# Patient Record
Sex: Male | Born: 1970 | Race: Black or African American | Hispanic: No | Marital: Married | Smoking: Never smoker
Health system: Southern US, Community
[De-identification: ages and names within clinical notes are randomized; demographics above are authoritative.]

---

## 1998-08-10 ENCOUNTER — Encounter (HOSPITAL_COMMUNITY): Admission: RE | Admit: 1998-08-10 | Discharge: 1998-11-08 | Payer: Self-pay

## 1998-11-13 ENCOUNTER — Emergency Department (HOSPITAL_COMMUNITY): Admission: EM | Admit: 1998-11-13 | Discharge: 1998-11-13 | Payer: Self-pay | Admitting: Emergency Medicine

## 1999-02-08 ENCOUNTER — Emergency Department (HOSPITAL_COMMUNITY): Admission: EM | Admit: 1999-02-08 | Discharge: 1999-02-08 | Payer: Self-pay | Admitting: Emergency Medicine

## 1999-04-17 ENCOUNTER — Encounter: Admission: RE | Admit: 1999-04-17 | Discharge: 1999-04-17 | Payer: Self-pay | Admitting: *Deleted

## 1999-08-01 ENCOUNTER — Emergency Department (HOSPITAL_COMMUNITY): Admission: EM | Admit: 1999-08-01 | Discharge: 1999-08-01 | Payer: Self-pay | Admitting: Emergency Medicine

## 2000-06-24 ENCOUNTER — Emergency Department (HOSPITAL_COMMUNITY): Admission: EM | Admit: 2000-06-24 | Discharge: 2000-06-24 | Payer: Self-pay | Admitting: Emergency Medicine

## 2000-06-26 ENCOUNTER — Emergency Department (HOSPITAL_COMMUNITY): Admission: EM | Admit: 2000-06-26 | Discharge: 2000-06-26 | Payer: Self-pay | Admitting: *Deleted

## 2001-03-21 ENCOUNTER — Emergency Department (HOSPITAL_COMMUNITY): Admission: EM | Admit: 2001-03-21 | Discharge: 2001-03-21 | Payer: Self-pay | Admitting: Emergency Medicine

## 2001-03-22 ENCOUNTER — Emergency Department (HOSPITAL_COMMUNITY): Admission: EM | Admit: 2001-03-22 | Discharge: 2001-03-22 | Payer: Self-pay | Admitting: Emergency Medicine

## 2001-09-28 ENCOUNTER — Emergency Department (HOSPITAL_COMMUNITY): Admission: EM | Admit: 2001-09-28 | Discharge: 2001-09-28 | Payer: Self-pay | Admitting: Emergency Medicine

## 2002-02-21 ENCOUNTER — Emergency Department (HOSPITAL_COMMUNITY): Admission: EM | Admit: 2002-02-21 | Discharge: 2002-02-21 | Payer: Self-pay | Admitting: Emergency Medicine

## 2002-05-09 ENCOUNTER — Encounter: Payer: Self-pay | Admitting: Emergency Medicine

## 2002-05-09 ENCOUNTER — Emergency Department (HOSPITAL_COMMUNITY): Admission: EM | Admit: 2002-05-09 | Discharge: 2002-05-09 | Payer: Self-pay | Admitting: Emergency Medicine

## 2002-08-01 ENCOUNTER — Emergency Department (HOSPITAL_COMMUNITY): Admission: EM | Admit: 2002-08-01 | Discharge: 2002-08-01 | Payer: Self-pay

## 2002-10-03 ENCOUNTER — Emergency Department (HOSPITAL_COMMUNITY): Admission: EM | Admit: 2002-10-03 | Discharge: 2002-10-03 | Payer: Self-pay | Admitting: Emergency Medicine

## 2002-10-20 ENCOUNTER — Emergency Department (HOSPITAL_COMMUNITY): Admission: EM | Admit: 2002-10-20 | Discharge: 2002-10-20 | Payer: Self-pay | Admitting: Emergency Medicine

## 2003-02-07 ENCOUNTER — Emergency Department (HOSPITAL_COMMUNITY): Admission: EM | Admit: 2003-02-07 | Discharge: 2003-02-07 | Payer: Self-pay | Admitting: Emergency Medicine

## 2003-02-24 ENCOUNTER — Emergency Department (HOSPITAL_COMMUNITY): Admission: EM | Admit: 2003-02-24 | Discharge: 2003-02-24 | Payer: Self-pay | Admitting: Emergency Medicine

## 2004-02-11 ENCOUNTER — Emergency Department (HOSPITAL_COMMUNITY): Admission: EM | Admit: 2004-02-11 | Discharge: 2004-02-11 | Payer: Self-pay | Admitting: *Deleted

## 2004-03-31 ENCOUNTER — Emergency Department (HOSPITAL_COMMUNITY): Admission: EM | Admit: 2004-03-31 | Discharge: 2004-03-31 | Payer: Self-pay | Admitting: Emergency Medicine

## 2004-09-17 ENCOUNTER — Emergency Department (HOSPITAL_COMMUNITY): Admission: EM | Admit: 2004-09-17 | Discharge: 2004-09-17 | Payer: Self-pay | Admitting: Family Medicine

## 2005-01-06 ENCOUNTER — Emergency Department (HOSPITAL_COMMUNITY): Admission: EM | Admit: 2005-01-06 | Discharge: 2005-01-06 | Payer: Self-pay | Admitting: Family Medicine

## 2005-08-28 ENCOUNTER — Emergency Department (HOSPITAL_COMMUNITY): Admission: EM | Admit: 2005-08-28 | Discharge: 2005-08-29 | Payer: Self-pay | Admitting: Emergency Medicine

## 2005-12-24 ENCOUNTER — Emergency Department (HOSPITAL_COMMUNITY): Admission: EM | Admit: 2005-12-24 | Discharge: 2005-12-24 | Payer: Self-pay | Admitting: Emergency Medicine

## 2006-04-16 ENCOUNTER — Encounter: Admission: RE | Admit: 2006-04-16 | Discharge: 2006-04-16 | Payer: Self-pay | Admitting: Nephrology

## 2006-11-09 ENCOUNTER — Encounter: Admission: RE | Admit: 2006-11-09 | Discharge: 2006-11-09 | Payer: Self-pay | Admitting: Nephrology

## 2012-02-11 ENCOUNTER — Encounter: Payer: Self-pay | Admitting: Internal Medicine

## 2012-02-11 ENCOUNTER — Ambulatory Visit (INDEPENDENT_AMBULATORY_CARE_PROVIDER_SITE_OTHER): Payer: BC Managed Care – PPO | Admitting: Internal Medicine

## 2012-02-11 DIAGNOSIS — M79675 Pain in left toe(s): Secondary | ICD-10-CM | POA: Insufficient documentation

## 2012-02-11 DIAGNOSIS — Z23 Encounter for immunization: Secondary | ICD-10-CM

## 2012-02-11 DIAGNOSIS — Z0000 Encounter for general adult medical examination without abnormal findings: Secondary | ICD-10-CM | POA: Insufficient documentation

## 2012-02-11 NOTE — Assessment & Plan Note (Addendum)
Td   > 10 years?, gave one today Never had a cscope EKG today nsr Counseled-- diet,exercise, STE Labs

## 2012-02-11 NOTE — Patient Instructions (Signed)
Came back fasting: FLP CMP CBC TSH--- v70

## 2012-02-11 NOTE — Assessment & Plan Note (Signed)
slt swollen, see exam XR If pain persist pt to let me know

## 2012-02-11 NOTE — Progress Notes (Signed)
  Subjective:    Patient ID: Kurt Downs, male    DOB: April 20, 1971, 41 y.o.   MRN: [id number]  HPI New patient, requests a physical Doing well but c/o hurting at the 4 left toe  x 2 weeks, no obvious injury  Past medical history No active problems   Past surgical history Tonsilectomy Vasectomy 2012   Social history Married, children x 6  Alcohol-- socially Tobacco-- no Drugs--no Conservation officer, nature  Exercise-- very active  Works 2 jobs, Administrator, [employer], cleaning   Family history Diabetes--no CAD--no Stroke--no Colon cancer--no Prostate cancer--no    Review of Systems  Constitutional: Negative for fever and unexpected weight change. Fatigue: occ fatigue, works 2 jobs.  Respiratory: Negative for cough and shortness of breath.   Cardiovascular: Negative for chest pain and leg swelling.  Gastrointestinal: Negative for abdominal pain and blood in stool.  Genitourinary: Negative for dysuria, hematuria and difficulty urinating.  Psychiatric/Behavioral:       No depression or anxiety        Objective:   Physical Exam  Constitutional: He is oriented to person, place, and time. He appears well-developed and well-nourished. No distress.  HENT:  Head: Normocephalic and atraumatic.  Neck: No thyromegaly present.  Cardiovascular: Normal rate, regular rhythm and normal heart sounds.   No murmur heard. Pulmonary/Chest: Effort normal and breath sounds normal. No respiratory distress. He has no wheezes. He has no rales.  Abdominal: Soft. Bowel sounds are normal. He exhibits no distension. There is no tenderness. There is no rebound and no guarding.  Musculoskeletal: He exhibits no edema.       4th L toe is slt swollen proximally compared to R , no red-warm ; it is moderately tender   Neurological: He is alert and oriented to person, place, and time.  Skin: He is not diaphoretic.  Psychiatric: He has a normal mood and affect. His behavior is normal. Thought content normal.        Assessment & Plan:

## 2012-02-13 ENCOUNTER — Ambulatory Visit (INDEPENDENT_AMBULATORY_CARE_PROVIDER_SITE_OTHER)
Admission: RE | Admit: 2012-02-13 | Discharge: 2012-02-13 | Disposition: A | Discharge status: Home / Self Care | Payer: BC Managed Care – PPO | Source: Ambulatory Visit | Attending: Internal Medicine | Admitting: Internal Medicine

## 2012-02-13 DIAGNOSIS — M79609 Pain in unspecified limb: Secondary | ICD-10-CM

## 2012-02-13 DIAGNOSIS — M79675 Pain in left toe(s): Secondary | ICD-10-CM

## 2012-02-20 ENCOUNTER — Other Ambulatory Visit: Payer: Self-pay | Admitting: Internal Medicine

## 2012-02-20 DIAGNOSIS — Z0000 Encounter for general adult medical examination without abnormal findings: Secondary | ICD-10-CM

## 2012-02-27 ENCOUNTER — Other Ambulatory Visit (INDEPENDENT_AMBULATORY_CARE_PROVIDER_SITE_OTHER): Payer: BC Managed Care – PPO

## 2012-02-27 DIAGNOSIS — Z0000 Encounter for general adult medical examination without abnormal findings: Secondary | ICD-10-CM

## 2012-02-27 LAB — LIPID PANEL
Cholesterol: 204 mg/dL — ABNORMAL HIGH (ref 0–200)
HDL: 54.3 mg/dL (ref >39.00)
Triglycerides: 110 mg/dL (ref 0.0–149.0)
VLDL: 22 mg/dL (ref 0.0–40.0)

## 2012-02-27 LAB — CBC WITH DIFFERENTIAL/PLATELET
Basophils Absolute: 0 10*3/uL (ref 0.0–0.1)
Eosinophils Absolute: 0.1 10*3/uL (ref 0.0–0.7)
Lymphocytes Relative: 24.5 % (ref 12.0–46.0)
MCHC: 33.1 g/dL (ref 30.0–36.0)
Neutrophils Relative %: 63 % (ref 43.0–77.0)
Platelets: 237 10*3/uL (ref 150.0–400.0)
RDW: 14.8 % — ABNORMAL HIGH (ref 11.5–14.6)

## 2012-02-27 LAB — TSH: TSH: 0.57 u[IU]/mL (ref 0.35–5.50)

## 2012-02-27 LAB — COMPREHENSIVE METABOLIC PANEL
AST: 46 U/L — ABNORMAL HIGH (ref 0–37)
Alkaline Phosphatase: 76 U/L (ref 39–117)
BUN: 17 mg/dL (ref 6–23)
Creatinine, Ser: 0.8 mg/dL (ref 0.4–1.5)
Total Bilirubin: 0.2 mg/dL — ABNORMAL LOW (ref 0.3–1.2)

## 2012-02-27 LAB — LDL CHOLESTEROL, DIRECT: Direct LDL: 137 mg/dL

## 2012-03-02 ENCOUNTER — Encounter: Payer: Self-pay | Admitting: Internal Medicine

## 2013-03-07 ENCOUNTER — Encounter: Payer: BC Managed Care – PPO | Admitting: Internal Medicine

## 2013-08-11 ENCOUNTER — Encounter: Payer: BC Managed Care – PPO | Admitting: Internal Medicine

## 2013-08-30 ENCOUNTER — Encounter: Payer: BC Managed Care – PPO | Admitting: Internal Medicine

## 2015-10-15 ENCOUNTER — Encounter (HOSPITAL_COMMUNITY): Payer: Self-pay | Admitting: Emergency Medicine

## 2015-10-15 ENCOUNTER — Emergency Department (HOSPITAL_COMMUNITY): Payer: BLUE CROSS/BLUE SHIELD

## 2015-10-15 ENCOUNTER — Emergency Department (HOSPITAL_COMMUNITY)
Admission: EM | Admit: 2015-10-15 | Discharge: 2015-10-15 | Disposition: A | Discharge status: Home / Self Care | Payer: BLUE CROSS/BLUE SHIELD | Attending: Emergency Medicine | Admitting: Emergency Medicine

## 2015-10-15 DIAGNOSIS — R630 Anorexia: Secondary | ICD-10-CM | POA: Diagnosis not present

## 2015-10-15 DIAGNOSIS — R110 Nausea: Secondary | ICD-10-CM | POA: Insufficient documentation

## 2015-10-15 DIAGNOSIS — R0981 Nasal congestion: Secondary | ICD-10-CM | POA: Insufficient documentation

## 2015-10-15 DIAGNOSIS — R519 Headache, unspecified: Secondary | ICD-10-CM

## 2015-10-15 DIAGNOSIS — R5383 Other fatigue: Secondary | ICD-10-CM | POA: Insufficient documentation

## 2015-10-15 DIAGNOSIS — R05 Cough: Secondary | ICD-10-CM | POA: Insufficient documentation

## 2015-10-15 DIAGNOSIS — R6889 Other general symptoms and signs: Secondary | ICD-10-CM

## 2015-10-15 DIAGNOSIS — R51 Headache: Secondary | ICD-10-CM | POA: Diagnosis present

## 2015-10-15 DIAGNOSIS — I10 Essential (primary) hypertension: Secondary | ICD-10-CM | POA: Diagnosis not present

## 2015-10-15 MED ORDER — DIPHENHYDRAMINE HCL 50 MG/ML IJ SOLN
INTRAMUSCULAR | Status: AC
  Filled 2015-10-15: qty 1

## 2015-10-15 MED ORDER — DEXAMETHASONE SODIUM PHOSPHATE 10 MG/ML IJ SOLN
10.0000 mg | Freq: Once | INTRAMUSCULAR | Status: AC
  Administered 2015-10-15: 10 mg via INTRAVENOUS

## 2015-10-15 MED ORDER — DEXAMETHASONE SODIUM PHOSPHATE 10 MG/ML IJ SOLN
INTRAMUSCULAR | Status: AC
  Administered 2015-10-15: 10 mg via INTRAVENOUS
  Filled 2015-10-15: qty 1

## 2015-10-15 MED ORDER — PROCHLORPERAZINE EDISYLATE 5 MG/ML IJ SOLN
5.0000 mg | Freq: Once | INTRAMUSCULAR | Status: AC
  Administered 2015-10-15: 5 mg via INTRAVENOUS

## 2015-10-15 MED ORDER — IBUPROFEN 800 MG PO TABS
800.0000 mg | ORAL_TABLET | Freq: Once | ORAL | Status: AC
  Administered 2015-10-15: 800 mg via ORAL

## 2015-10-15 MED ORDER — PROCHLORPERAZINE EDISYLATE 5 MG/ML IJ SOLN
INTRAMUSCULAR | Status: AC
  Administered 2015-10-15: 5 mg via INTRAVENOUS
  Filled 2015-10-15: qty 2

## 2015-10-15 MED ORDER — SODIUM CHLORIDE 0.9 % IV BOLUS (SEPSIS)
1000.0000 mL | Freq: Once | INTRAVENOUS | Status: AC
  Administered 2015-10-15: 1000 mL via INTRAVENOUS

## 2015-10-15 MED ORDER — DIPHENHYDRAMINE HCL 50 MG/ML IJ SOLN
25.0000 mg | Freq: Once | INTRAMUSCULAR | Status: AC
  Administered 2015-10-15: 25 mg via INTRAVENOUS

## 2015-10-15 MED ORDER — IBUPROFEN 800 MG PO TABS
ORAL_TABLET | ORAL | Status: AC
  Filled 2015-10-15: qty 1

## 2015-10-15 NOTE — Discharge Instructions (Signed)
You are having a headache. No specific cause was found today for your headache. It may have been a migraine or other cause of headache. Stress, anxiety, fatigue, and depression are common triggers for headaches. Your headache today does not appear to be life-threatening or require hospitalization, but often the exact cause of headaches is not determined in the emergency department. Therefore, follow-up with your doctor is very important to find out what may have caused your headache, and whether or not you need any further diagnostic testing or treatment. Sometimes headaches can appear benign (not harmful), but then more serious symptoms can develop which should prompt an immediate re-evaluation by your doctor or the emergency department. SEEK MEDICAL ATTENTION IF:  Your headache becomes severe.  You have repeated vomiting.  You have a stiff neck.  You have a loss of vision.  You have problems with speech.  You have pain in the eye or ear.  You have muscular weakness or loss of muscle control.  You lose your balance or have trouble walking.  You feel faint or pass out.  You have confusion. RETURN IMMEDIATELY IF you develop a sudden, severe headache or confusion, become poorly responsive or faint, develop a fever above 100.68F or problem breathing, have a change in speech, vision, swallowing, or understanding, or develop new weakness, numbness, tingling, incoordination, or have a seizure. Hypertension Hypertension, commonly called high blood pressure, is when the force of blood pumping through your arteries is too strong. Your arteries are the blood vessels that carry blood from your heart throughout your body. A blood pressure reading consists of a higher number over a lower number, such as 110/72. The higher number (systolic) is the pressure inside your arteries when your heart pumps. The lower number (diastolic) is the pressure inside your arteries when your heart relaxes. Ideally you want  your blood pressure below 120/80. Hypertension forces your heart to work harder to pump blood. Your arteries may become narrow or stiff. Having untreated or uncontrolled hypertension can cause heart attack, stroke, kidney disease, and other problems. RISK FACTORS Some risk factors for high blood pressure are controllable. Others are not.  Risk factors you cannot control include:   Race. You may be at higher risk if you are African American.  Age. Risk increases with age.  Gender. Men are at higher risk than women before age 45 years. After age 45, women are at higher risk than men. Risk factors you can control include:  Not getting enough exercise or physical activity.  Being overweight.  Getting too much fat, sugar, calories, or salt in your diet.  Drinking too much alcohol. SIGNS AND SYMPTOMS Hypertension does not usually cause signs or symptoms. Extremely high blood pressure (hypertensive crisis) may cause headache, anxiety, shortness of breath, and nosebleed. DIAGNOSIS To check if you have hypertension, your health care provider will measure your blood pressure while you are seated, with your arm held at the level of your heart. It should be measured at least twice using the same arm. Certain conditions can cause a difference in blood pressure between your right and left arms. A blood pressure reading that is higher than normal on one occasion does not mean that you need treatment. If it is not clear whether you have high blood pressure, you may be asked to return on a different day to have your blood pressure checked again. Or, you may be asked to monitor your blood pressure at home for 1 or more weeks. TREATMENT Treating high  blood pressure includes making lifestyle changes and possibly taking medicine. Living a healthy lifestyle can help lower high blood pressure. You may need to change some of your habits. Lifestyle changes may include:  Following the DASH diet. This diet is high  in fruits, vegetables, and whole grains. It is low in salt, red meat, and added sugars.  Keep your sodium intake below 2,300 mg per day.  Getting at least 30-45 minutes of aerobic exercise at least 4 times per week.  Losing weight if necessary.  Not smoking.  Limiting alcoholic beverages.  Learning ways to reduce stress. Your health care provider may prescribe medicine if lifestyle changes are not enough to get your blood pressure under control, and if one of the following is true:  You are 88-41 years of age and your systolic blood pressure is above 140.  You are 7 years of age or older, and your systolic blood pressure is above 150.  Your diastolic blood pressure is above 90.  You have diabetes, and your systolic blood pressure is over 140 or your diastolic blood pressure is over 90.  You have kidney disease and your blood pressure is above 140/90.  You have heart disease and your blood pressure is above 140/90. Your personal target blood pressure may vary depending on your medical conditions, your age, and other factors. HOME CARE INSTRUCTIONS  Have your blood pressure rechecked as directed by your health care provider.   Take medicines only as directed by your health care provider. Follow the directions carefully. Blood pressure medicines must be taken as prescribed. The medicine does not work as well when you skip doses. Skipping doses also puts you at risk for problems.  Do not smoke.   Monitor your blood pressure at home as directed by your health care provider. SEEK MEDICAL CARE IF:   You think you are having a reaction to medicines taken.  You have recurrent headaches or feel dizzy.  You have swelling in your ankles.  You have trouble with your vision. SEEK IMMEDIATE MEDICAL CARE IF:  You develop a severe headache or confusion.  You have unusual weakness, numbness, or feel faint.  You have severe chest or abdominal pain.  You vomit repeatedly.  You  have trouble breathing. MAKE SURE YOU:   Understand these instructions.  Will watch your condition.  Will get help right away if you are not doing well or get worse.   This information is not intended to replace advice given to you by your health care provider. Make sure you discuss any questions you have with your health care provider.   Document Released: 12/08/2005 Document Revised: 04/24/2015 Document Reviewed: 09/30/2013 Elsevier Interactive Patient Education 2016 Elsevier Inc. Viral Infections A virus is a type of germ. Viruses can cause:  Minor sore throats.  Aches and pains.  Headaches.  Runny nose.  Rashes.  Watery eyes.  Tiredness.  Coughs.  Loss of appetite.  Feeling sick to your stomach (nausea).  Throwing up (vomiting).  Watery poop (diarrhea). HOME CARE   Only take medicines as told by your doctor.  Drink enough water and fluids to keep your pee (urine) clear or pale yellow. Sports drinks are a good choice.  Get plenty of rest and eat healthy. Soups and broths with crackers or rice are fine. GET HELP RIGHT AWAY IF:   You have a very bad headache.  You have shortness of breath.  You have chest pain or neck pain.  You have an unusual  rash.  You cannot stop throwing up.  You have watery poop that does not stop.  You cannot keep fluids down.  You or your child has a temperature by mouth above 102 F (38.9 C), not controlled by medicine.  Your baby is older than 3 months with a rectal temperature of 102 F (38.9 C) or higher.  Your baby is 66 months old or younger with a rectal temperature of 100.4 F (38 C) or higher. MAKE SURE YOU:   Understand these instructions.  Will watch this condition.  Will get help right away if you are not doing well or get worse.   This information is not intended to replace advice given to you by your health care provider. Make sure you discuss any questions you have with your health care provider.     Document Released: 11/20/2008 Document Revised: 03/01/2012 Document Reviewed: 05/16/2015 Elsevier Interactive Patient Education 2016 Elsevier Inc[employer]  General Headache Without Cause A headache is pain or discomfort felt around the head or neck area. The specific cause of a headache may not be found. There are many causes and types of headaches. A few common ones are:  Tension headaches.  Migraine headaches.  Cluster headaches.  Chronic daily headaches. HOME CARE INSTRUCTIONS  Watch your condition for any changes. Take these steps to help with your condition: Managing Pain  Take over-the-counter and prescription medicines only as told by your health care provider.  Lie down in a dark, quiet room when you have a headache.  If directed, apply ice to the head and neck area:  Put ice in a plastic bag.  Place a towel between your skin and the bag.  Leave the ice on for 20 minutes, 2-3 times per day.  Use a heating pad or hot shower to apply heat to the head and neck area as told by your health care provider.  Keep lights dim if bright lights bother you or make your headaches worse. Eating and Drinking  Eat meals on a regular schedule.  Limit alcohol use.  Decrease the amount of caffeine you drink, or stop drinking caffeine. General Instructions  Keep all follow-up visits as told by your health care provider. This is important.  Keep a headache journal to help find out what may trigger your headaches. For example, write down:  What you eat and drink.  How much sleep you get.  Any change to your diet or medicines.  Try massage or other relaxation techniques.  Limit stress.  Sit up straight, and do not tense your muscles.  Do not use tobacco products, including cigarettes, chewing tobacco, or e-cigarettes. If you need help quitting, ask your health care provider.  Exercise regularly as told by your health care provider.  Sleep on a regular schedule. Get 7-9 hours  of sleep, or the amount recommended by your health care provider. SEEK MEDICAL CARE IF:   Your symptoms are not helped by medicine.  You have a headache that is different from the usual headache.  You have nausea or you vomit.  You have a fever.  This information is not intended to replace advice given to you by your health care provider. Make sure you discuss any questions you have with your health care provider.   Document Released: 12/08/2005 Document Revised: 08/29/2015 Document Reviewed: 04/02/2015 Elsevier Interactive Patient Education [employer]

## 2015-10-15 NOTE — ED Notes (Signed)
Per pt, states flu like symptoms which started over the weekend-bodayaches, fever, cough, congestion

## 2015-10-15 NOTE — ED Provider Notes (Signed)
CSN: [id number]     Arrival date & time 10/15/15  0741 History   First MD Initiated Contact with Patient 10/15/15 [phone]     Chief Complaint  Patient presents with  . flu like symptoms      (Consider location/radiation/quality/duration/timing/severity/associated sxs/prior Treatment) HPI   Aurther LoftFredrick L Nevils Is a 44 year old male who presents emergency Department with chief complaint of flulike symptoms. Onset of symptoms 4 days ago. Wife had the same last week. He complains of body aches, congestion, cough, fatigue, decreased appetite. Patient took TheraFlu last night which helped him sleep. He complains of severe headache today which is worse with cough. He denies any neck stiffness, his symptoms mild nausea without vomiting. He denies phonophobia, fever, other neurologic symptoms. History reviewed. No pertinent past medical history. History reviewed. No pertinent past surgical history. No family history on file. Social History  Substance Use Topics  . Smoking status: Never Smoker   . Smokeless tobacco: Never Used  . Alcohol Use: Yes    Review of Systems   Ten systems reviewed and are negative for acute change, except as noted in the HPI.   Allergies  Review of patient's allergies indicates no known allergies.  Home Medications   Prior to Admission medications   Medication Sig Start Date End Date Taking? Authorizing Provider  aspirin-sod bicarb-citric acid (ALKA-SELTZER) 325 MG TBEF tablet Take 650 mg by mouth every 6 (six) hours as needed (cold symptoms).   Yes Historical Provider, MD  Pheniramine-PE-APAP (THERAFLU FLU & SORE THROAT) 20-10-650 MG PACK Take 1 Package by mouth every 6 (six) hours as needed (cold symptoms).   Yes Historical Provider, MD   BP 140/93 mmHg  Pulse 61  Temp(Src) 99.5 F (37.5 C) (Oral)  Resp 17  Ht 6\' 1"  (1.854 m)  Wt 240 lb (108.863 kg)  BMI 31.67 kg/m2  SpO2 100% Physical Exam  Constitutional: He is oriented to person, place, and time.  He appears well-developed and well-nourished. No distress.  HENT:  Head: Normocephalic and atraumatic.  Mouth/Throat: Oropharynx is clear and moist.  Eyes: Conjunctivae and EOM are normal. Pupils are equal, round, and reactive to light. No scleral icterus.  No horizontal, vertical or rotational nystagmus  Neck: Normal range of motion. Neck supple.  Full active and passive ROM without pain No midline or paraspinal tenderness No nuchal rigidity or meningeal signs  Cardiovascular: Normal rate, regular rhythm and intact distal pulses.   Pulmonary/Chest: Effort normal and breath sounds normal. No respiratory distress. He has no wheezes. He has no rales.  Abdominal: Soft. Bowel sounds are normal. There is no tenderness. There is no rebound and no guarding.  Musculoskeletal: Normal range of motion.  Lymphadenopathy:    He has no cervical adenopathy.  Neurological: He is alert and oriented to person, place, and time. He has normal reflexes. No cranial nerve deficit. He exhibits normal muscle tone. Coordination normal.  Mental Status:  Alert, oriented, thought content appropriate. Speech fluent without evidence of aphasia. Able to follow 2 step commands without difficulty.  Cranial Nerves:  II:  Peripheral visual fields grossly normal, pupils equal, round, reactive to light III,IV, VI: ptosis not present, extra-ocular motions intact bilaterally  V,VII: smile symmetric, facial light touch sensation equal VIII: hearing grossly normal bilaterally  IX,X: midline uvula rise  XI: bilateral shoulder shrug equal and strong XII: midline tongue extension  Motor:  5/5 in upper and lower extremities bilaterally including strong and equal grip strength and dorsiflexion/plantar flexion Sensory: Pinprick and light  touch normal in all extremities.  Deep Tendon Reflexes: 2+ and symmetric  Cerebellar: normal finger-to-nose with bilateral upper extremities Gait: normal gait and balance CV: distal pulses  palpable throughout   Skin: Skin is warm and dry. No rash noted. He is not diaphoretic.  Psychiatric: He has a normal mood and affect. His behavior is normal. Judgment and thought content normal.  Nursing note and vitals reviewed.   ED Course  Procedures (including critical care time) Labs Review Labs Reviewed - No data to display  Imaging Review Dg Chest 2 View  10/15/2015  CLINICAL DATA:  Cough and generalized body aches for 3 days. EXAM: CHEST  2 VIEW COMPARISON:  11/09/2006 FINDINGS: The heart size and mediastinal contours are within normal limits. Both lungs are clear. The visualized skeletal structures are unremarkable. IMPRESSION: No active cardiopulmonary disease. Electronically Signed   By: Myles Rosenthal M.D.   On: 10/15/2015 09:37   I have personally reviewed and evaluated these images and lab results as part of my medical decision-making.   EKG Interpretation None      MDM   Final diagnoses:  Essential hypertension    Patient with flulike illness, headache and symptoms treated with fluids, Motrin, Compazine and Decadron for headache. Patient's headache symptoms resolved. I doubt emergent cause of headache including but not limited to meningitis, subarachnoid hemorrhage, vasculitis. I discussed the reasons the patient should seek immediate medical attention to include changes in mental status, seizure, sudden, severe headache, uncontrolled nausea and vomiting, syncope. Patient expresses his understanding, agrees with plan of care.    Arthor Captain, PA-C 10/15/15 1059  Raeford Razor, MD 10/16/15 [phone]

## 2015-10-15 NOTE — ED Notes (Signed)
ED PA at bedside

## 2016-12-21 IMAGING — CR DG CHEST 2V
2 series · 2 of 2 positions shown · non-contrast
Comparison: 11/09/2006

CLINICAL DATA: Cough and generalized body aches for 3 days.

EXAM:
CHEST  2 VIEW

[w chest pa]
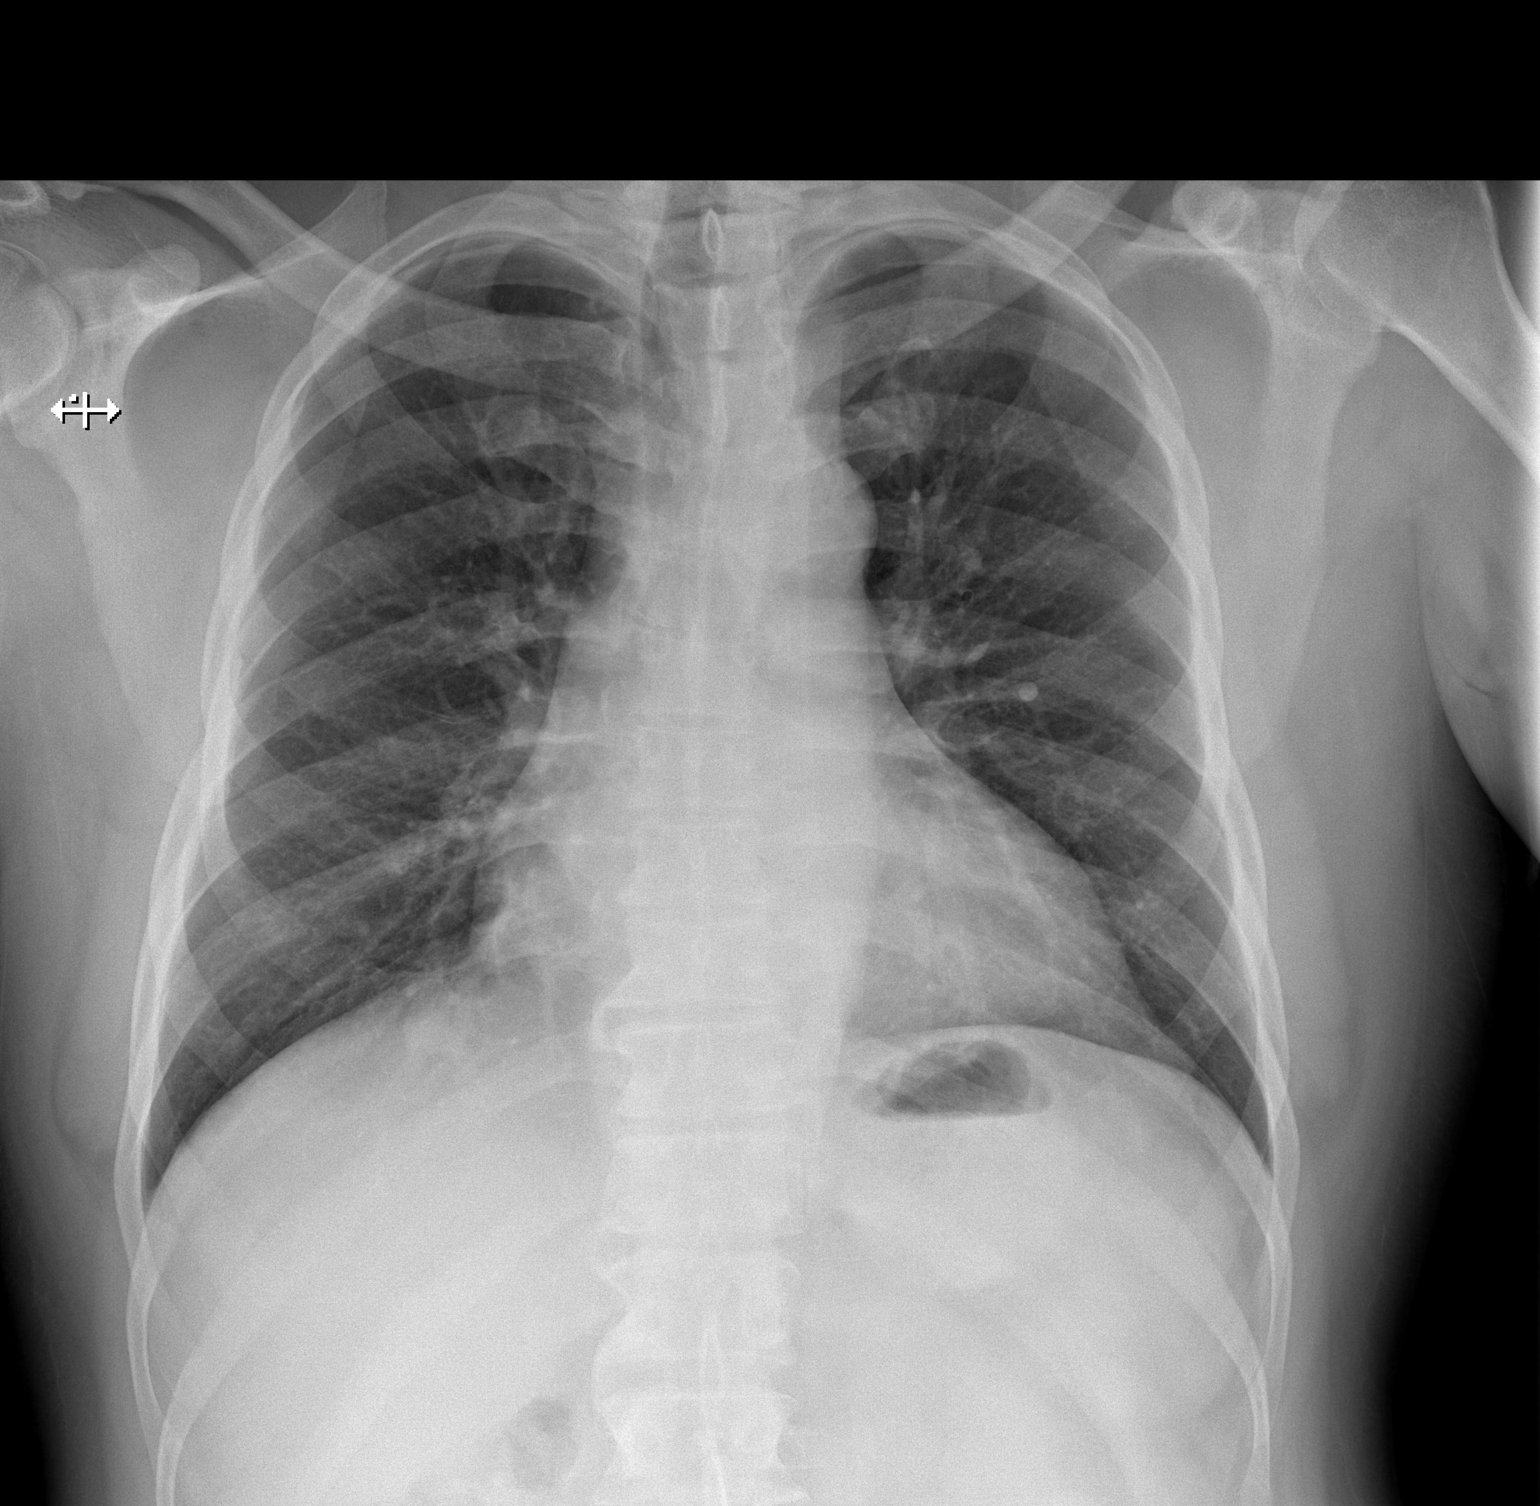

[w chest lat]
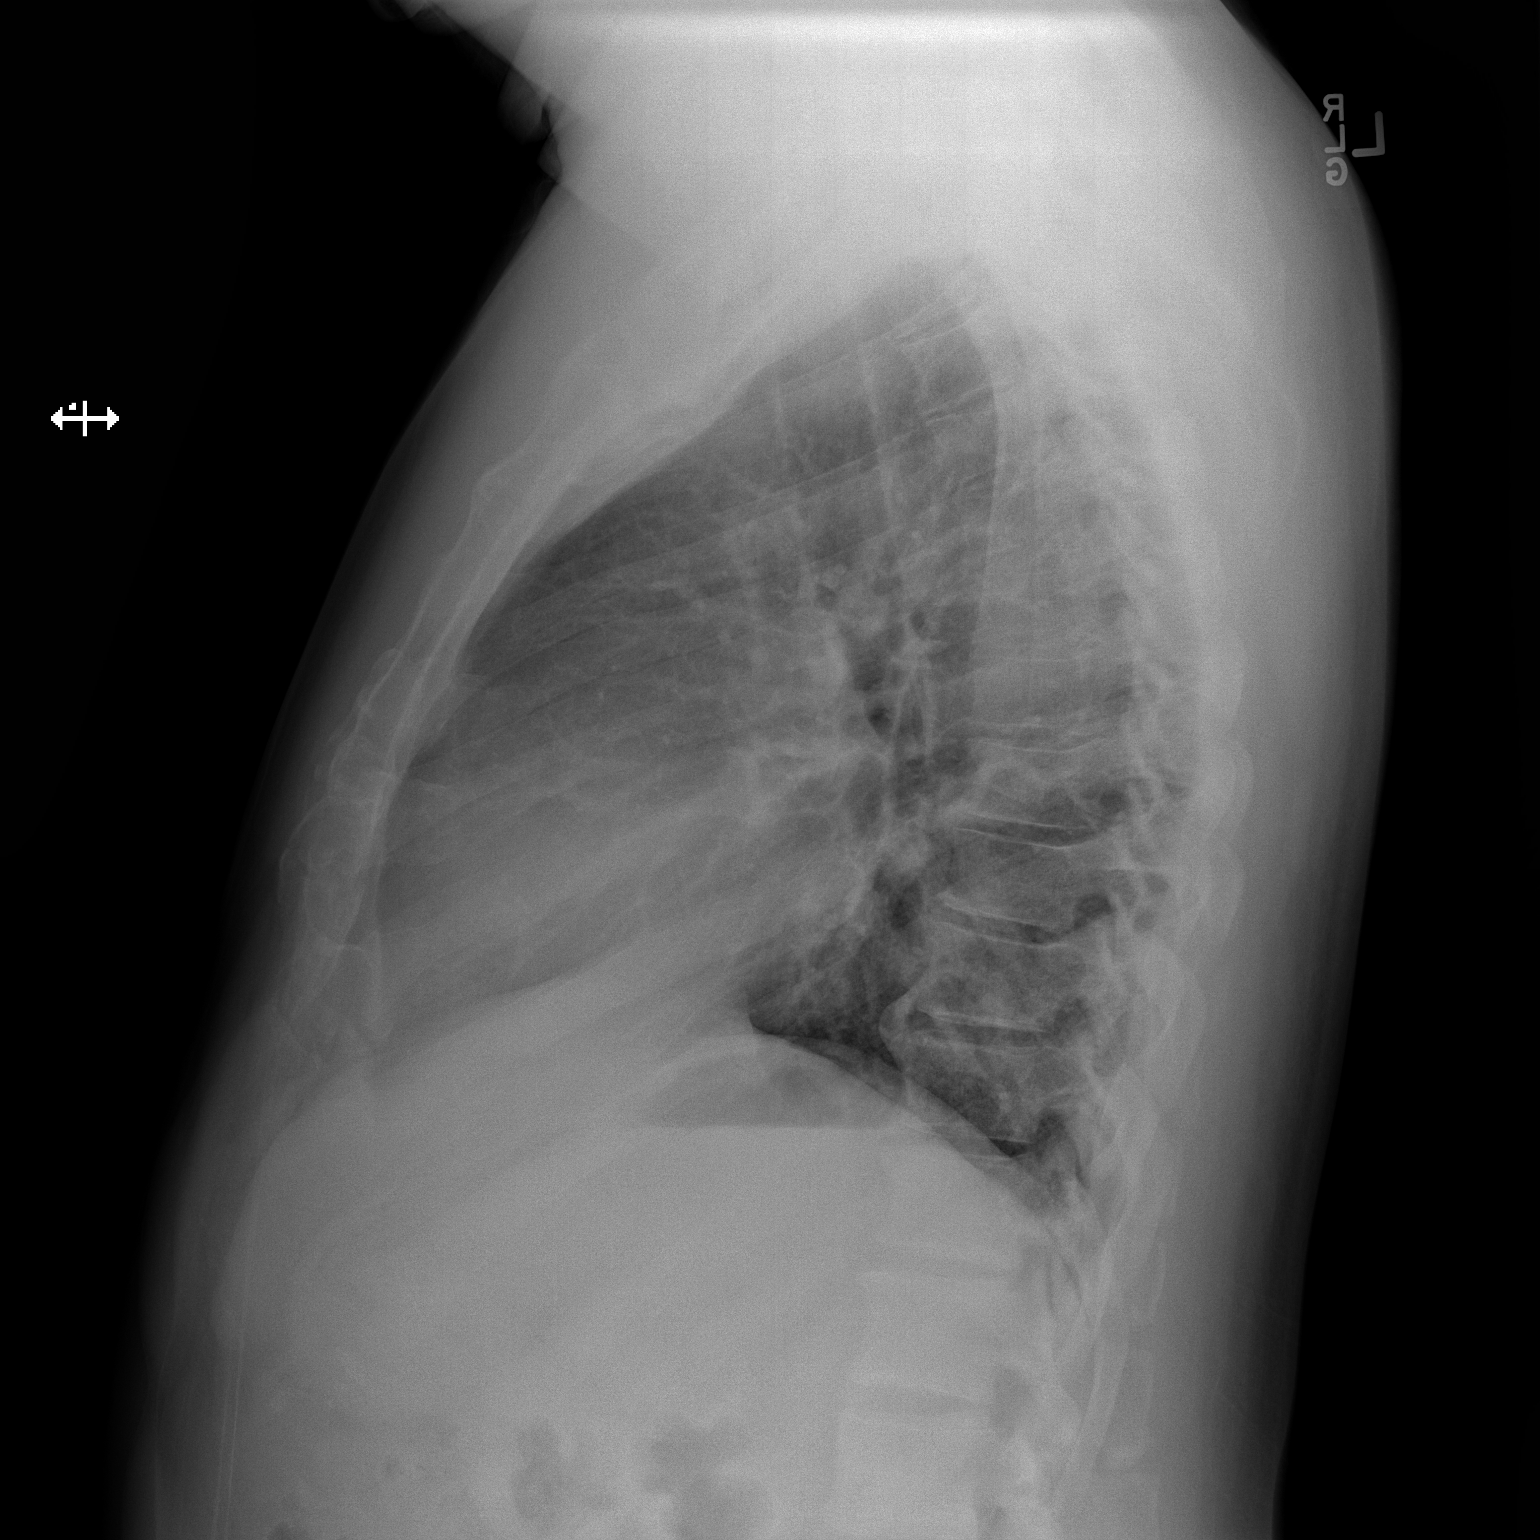

[2 of 2 positions shown; findings below may reference images not displayed]

FINDINGS: The heart size and mediastinal contours are within normal limits.
Both lungs are clear. The visualized skeletal structures are
unremarkable.
IMPRESSION: No active cardiopulmonary disease.

## 2021-02-28 LAB — HM COLONOSCOPY

## 2021-05-25 ENCOUNTER — Other Ambulatory Visit: Payer: Self-pay

## 2021-05-25 ENCOUNTER — Ambulatory Visit (HOSPITAL_COMMUNITY)
Admission: EM | Admit: 2021-05-25 | Discharge: 2021-05-25 | Disposition: A | Discharge status: Home / Self Care | Payer: 59 | Attending: Family Medicine | Admitting: Family Medicine

## 2021-05-25 ENCOUNTER — Encounter (HOSPITAL_COMMUNITY): Payer: Self-pay | Admitting: *Deleted

## 2021-05-25 DIAGNOSIS — J069 Acute upper respiratory infection, unspecified: Secondary | ICD-10-CM | POA: Diagnosis not present

## 2021-05-25 DIAGNOSIS — Z20822 Contact with and (suspected) exposure to covid-19: Secondary | ICD-10-CM | POA: Diagnosis not present

## 2021-05-25 NOTE — ED Triage Notes (Signed)
PT wife tested positive for covid yesterday and Pt has a cough.

## 2021-05-25 NOTE — ED Provider Notes (Signed)
[facility]    CSN: [id number] Arrival date & time: 05/25/21  1714      History   Chief Complaint Chief Complaint  Patient presents with  . Cough    HPI Kurt Downs is a 50 y.o. male.   Patient presenting today with 1 day history of cough.  Denies fever, chills, chest pain, shortness of breath, abdominal pain, nausea vomiting or diarrhea.  Wife tested positive for COVID yesterday, children all sick with high fevers, body aches, sore throat today.  So far not taking anything over-the-counter for symptoms.  Denies any known chronic pulmonary issues.     History reviewed. No pertinent past medical history.  Patient Active Problem List   Diagnosis Date Noted  . General medical examination 02/11/2012  . Pain in toe of left foot 02/11/2012    History reviewed. No pertinent surgical history.     Home Medications    Prior to Admission medications   Medication Sig Start Date End Date Taking? Authorizing Provider  aspirin-sod bicarb-citric acid (ALKA-SELTZER) 325 MG TBEF tablet Take 650 mg by mouth every 6 (six) hours as needed (cold symptoms).    [provider]  Pheniramine-PE-APAP (THERAFLU FLU & SORE THROAT) 20-10-650 MG PACK Take 1 Package by mouth every 6 (six) hours as needed (cold symptoms).    [provider]    Family History History reviewed. No pertinent family history.  Social History Social History   Tobacco Use  . Smoking status: Never Smoker  . Smokeless tobacco: Never Used  Substance Use Topics  . Alcohol use: Yes  . Drug use: No     Allergies   Patient has no known allergies.   Review of Systems Review of Systems Per HPI  Physical Exam Triage Vital Signs ED Triage Vitals  Enc Vitals Group     BP 05/25/21 1822 (!) 168/95     Pulse Rate 05/25/21 1822 92     Resp 05/25/21 1822 20     Temp 05/25/21 1822 99.4 F (37.4 C)     Temp Source 05/25/21 1822 Oral     SpO2 05/25/21 1822 96 %     Weight --       Height --      Head Circumference --      Peak Flow --      Pain Score 05/25/21 1823 0     Pain Loc --      Pain Edu? --      Excl. in GC? --    No data found.  Updated Vital Signs BP (!) 168/95   Pulse 92   Temp 99.4 F (37.4 C) (Oral)   Resp 20   SpO2 96%   Visual Acuity Right Eye Distance:   Left Eye Distance:   Bilateral Distance:    Right Eye Near:   Left Eye Near:    Bilateral Near:     Physical Exam Vitals and nursing note reviewed.  Constitutional:      Appearance: Normal appearance.  HENT:     Head: Atraumatic.     Right Ear: Tympanic membrane normal.     Left Ear: Tympanic membrane normal.     Nose: Nose normal.     Mouth/Throat:     Mouth: Mucous membranes are moist.     Pharynx: Oropharynx is clear. No posterior oropharyngeal erythema.  Eyes:     Extraocular Movements: Extraocular movements intact.     Conjunctiva/sclera: Conjunctivae normal.  Cardiovascular:  Rate and Rhythm: Normal rate and regular rhythm.     Heart sounds: Normal heart sounds.  Pulmonary:     Effort: Pulmonary effort is normal. No respiratory distress.     Breath sounds: Normal breath sounds. No wheezing or rales.  Abdominal:     General: Bowel sounds are normal. There is no distension.     Palpations: Abdomen is soft.     Tenderness: There is no abdominal tenderness. There is no guarding.  Musculoskeletal:        General: Normal range of motion.     Cervical back: Normal range of motion and neck supple.  Skin:    General: Skin is warm and dry.  Neurological:     General: No focal deficit present.     Mental Status: He is oriented to person, place, and time.  Psychiatric:        Mood and Affect: Mood normal.        Thought Content: Thought content normal.        Judgment: Judgment normal.      UC Treatments / Results  Labs (all labs ordered are listed, but only abnormal results are displayed) Labs Reviewed  SARS CORONAVIRUS 2 (TAT 6-24 HRS)     EKG   Radiology No results found.  Procedures Procedures (including critical care time)  Medications Ordered in UC Medications - No data to display  Initial Impression / Assessment and Plan / UC Course  I have reviewed the triage vital signs and the nursing notes.  Pertinent labs & imaging results that were available during my care of the patient were reviewed by me and considered in my medical decision making (see chart for details).     Exam and vital signs overall very reassuring, COVID PCR pending.  Discussed symptomatic management and over-the-counter supportive care.  Quarantine reviewed until test results return and discussed return precautions for acutely worsening symptoms at any time.  Final Clinical Impressions(s) / UC Diagnoses   Final diagnoses:  Viral URI with cough  Exposure to COVID-19 virus   Discharge Instructions   None    ED Prescriptions    None     PDMP not reviewed this encounter.   Particia Nearing, [place] 05/25/21 1947

## 2021-05-26 LAB — SARS CORONAVIRUS 2 (TAT 6-24 HRS): SARS Coronavirus 2: NEGATIVE

## 2022-01-28 ENCOUNTER — Emergency Department (HOSPITAL_COMMUNITY)
Admission: EM | Admit: 2022-01-28 | Discharge: 2022-01-29 | Disposition: A | Discharge status: Home / Self Care | Payer: 59 | Attending: Emergency Medicine | Admitting: Emergency Medicine

## 2022-01-28 ENCOUNTER — Other Ambulatory Visit: Payer: Self-pay

## 2022-01-28 ENCOUNTER — Encounter (HOSPITAL_COMMUNITY): Payer: Self-pay | Admitting: Emergency Medicine

## 2022-01-28 ENCOUNTER — Ambulatory Visit (HOSPITAL_COMMUNITY)
Admission: EM | Admit: 2022-01-28 | Discharge: 2022-01-28 | Disposition: A | Discharge status: Home / Self Care | Payer: 59 | Attending: Student | Admitting: Student

## 2022-01-28 DIAGNOSIS — R110 Nausea: Secondary | ICD-10-CM | POA: Diagnosis not present

## 2022-01-28 DIAGNOSIS — R739 Hyperglycemia, unspecified: Secondary | ICD-10-CM | POA: Insufficient documentation

## 2022-01-28 DIAGNOSIS — Z7984 Long term (current) use of oral hypoglycemic drugs: Secondary | ICD-10-CM | POA: Insufficient documentation

## 2022-01-28 DIAGNOSIS — E871 Hypo-osmolality and hyponatremia: Secondary | ICD-10-CM | POA: Insufficient documentation

## 2022-01-28 DIAGNOSIS — R3589 Other polyuria: Secondary | ICD-10-CM | POA: Diagnosis present

## 2022-01-28 LAB — CBC
HCT: 45.4 % (ref 39.0–52.0)
Hemoglobin: 14.7 g/dL (ref 13.0–17.0)
MCH: 26.3 pg (ref 26.0–34.0)
MCHC: 32.4 g/dL (ref 30.0–36.0)
MCV: 81.1 fL (ref 80.0–100.0)
Platelets: 268 10*3/uL (ref 150–400)
RBC: 5.6 MIL/uL (ref 4.22–5.81)
RDW: 12.7 % (ref 11.5–15.5)
WBC: 6.6 10*3/uL (ref 4.0–10.5)
nRBC: 0 % (ref 0.0–0.2)

## 2022-01-28 LAB — POCT URINALYSIS DIPSTICK, ED / UC
Bilirubin Urine: NEGATIVE
Glucose, UA: 500 mg/dL — AB
Ketones, ur: 15 mg/dL — AB
Leukocytes,Ua: NEGATIVE
Nitrite: NEGATIVE
Protein, ur: NEGATIVE mg/dL
Specific Gravity, Urine: 1.01 (ref 1.005–1.030)
Urobilinogen, UA: 0.2 mg/dL (ref 0.0–1.0)
pH: 7 (ref 5.0–8.0)

## 2022-01-28 LAB — BASIC METABOLIC PANEL
Anion gap: 9 (ref 5–15)
BUN: 10 mg/dL (ref 6–20)
CO2: 27 mmol/L (ref 22–32)
Calcium: 10 mg/dL (ref 8.9–10.3)
Chloride: 95 mmol/L — ABNORMAL LOW (ref 98–111)
Creatinine, Ser: 0.97 mg/dL (ref 0.61–1.24)
GFR, Estimated: >60 mL/min (ref >60)
Glucose, Bld: 536 mg/dL (ref 70–99)
Potassium: 4.5 mmol/L (ref 3.5–5.1)
Sodium: 131 mmol/L — ABNORMAL LOW (ref 135–145)

## 2022-01-28 LAB — CBG MONITORING, ED
Glucose-Capillary: 489 mg/dL — ABNORMAL HIGH (ref 70–99)
Glucose-Capillary: >600 mg/dL (ref 70–99)
Glucose-Capillary: 388 mg/dL — ABNORMAL HIGH (ref 70–99)

## 2022-01-28 NOTE — ED Provider Notes (Signed)
[facility]    CSN: [id number] Arrival date & time: 01/28/22  1443      History   Chief Complaint Chief Complaint  Patient presents with   Fatigue    HPI Kurt Downs is a 51 y.o. male presenting with fatigue, urinating at night, dry mouth.  Medical history prediabetes, states he is concerned this is progressed into diabetes. Not followed by PCP in years though does establish with one in about a month. Endorses some nausea but none currently. Denies vomiting, dizziness, CP, SOB.   HPI  History reviewed. No pertinent past medical history.  Patient Active Problem List   Diagnosis Date Noted   General medical examination 02/11/2012   Pain in toe of left foot 02/11/2012    History reviewed. No pertinent surgical history.     Home Medications    Prior to Admission medications   Not on File    Family History Family History  Problem Relation Age of Onset   Healthy Mother    Healthy Father     Social History Social History   Tobacco Use   Smoking status: Never   Smokeless tobacco: Never  Vaping Use   Vaping Use: Never used  Substance Use Topics   Alcohol use: Yes   Drug use: No     Allergies   Patient has no known allergies.   Review of Systems Review of Systems  Genitourinary:  Positive for frequency.  All other systems reviewed and are negative.   Physical Exam Triage Vital Signs ED Triage Vitals [01/28/22 1022]  Enc Vitals Group     BP      Pulse      Resp      Temp      Temp src      SpO2      Weight      Height      Head Circumference      Peak Flow      Pain Score 0     Pain Loc      Pain Edu?      Excl. in GC?    No data found.  Updated Vital Signs BP (!) 145/90 (BP Location: Right Arm) Comment (BP Location): large cuff   Pulse 71    Temp 98.1 F (36.7 C) (Oral)    Resp 20    SpO2 97%   Visual Acuity Right Eye Distance:   Left Eye Distance:   Bilateral Distance:    Right Eye Near:   Left Eye Near:     Bilateral Near:     Physical Exam Vitals reviewed.  Constitutional:      General: He is not in acute distress.    Appearance: Normal appearance. He is not ill-appearing or diaphoretic.  HENT:     Head: Normocephalic and atraumatic.  Cardiovascular:     Rate and Rhythm: Normal rate and regular rhythm.     Heart sounds: Normal heart sounds.  Pulmonary:     Effort: Pulmonary effort is normal.     Breath sounds: Normal breath sounds.  Skin:    General: Skin is warm.  Neurological:     General: No focal deficit present.     Mental Status: He is alert and oriented to person, place, and time.  Psychiatric:        Mood and Affect: Mood normal.        Behavior: Behavior normal.        Thought Content: Thought content  normal.        Judgment: Judgment normal.     UC Treatments / Results  Labs (all labs ordered are listed, but only abnormal results are displayed) Labs Reviewed  CBG MONITORING, ED - Abnormal; Notable for the following components:      Result Value   Glucose-Capillary >600 (*)    All other components within normal limits  POCT URINALYSIS DIPSTICK, ED / UC - Abnormal; Notable for the following components:   Glucose, UA 500 (*)    Ketones, ur 15 (*)    Hgb urine dipstick TRACE (*)    All other components within normal limits    EKG   Radiology No results found.  Procedures Procedures (including critical care time)  Medications Ordered in UC Medications - No data to display  Initial Impression / Assessment and Plan / UC Course  I have reviewed the triage vital signs and the nursing notes.  Pertinent labs & imaging results that were available during my care of the patient were reviewed by me and considered in my medical decision making (see chart for details).  Clinical Course as of 01/28/22 1118  Tue Jan 28, 2022  1038 Glucose-Capillary(!!): >600 [LG]    Clinical Course User Index [LG] Rhys Martini, PA-C    This patient is a very pleasant 51  y.o. year old male presenting with hyperglycemia. Afebrile, nontachy.  Nonfasting CBG >600. Concern for DKA.   Sent to ED via personal vehicle.   Discussed treatment plan with attending physician Dr. Tracie Harrier who is in agreement.    Final Clinical Impressions(s) / UC Diagnoses   Final diagnoses:  Hyperglycemia     Discharge Instructions      -Please had to the emergency department for further evaluation of your very high blood sugars. -You are at high risk of a complication called DKA  with sugars this high. Diabetic ketoacidosis (DKA) is a serious complication of diabetes that can be life-threatening. DKA is most common among people with type 1 diabetes. People with type 2 diabetes can also develop DKA. DKA develops when your body doesn't have enough insulin to allow blood sugar into your cells for use as energy. -Please head straight to the ED.     ED Prescriptions   None    PDMP not reviewed this encounter.   Rhys Martini, PA-C 01/28/22 1119

## 2022-01-28 NOTE — ED Provider Triage Note (Signed)
Emergency Medicine Provider Triage Evaluation Note  Kurt Downs , a 51 y.o. male  was evaluated in triage.  Pt complains of hyperglycemia.  Patient was seen at urgent care, with complaint of polyuria, polydipsia, and fatigue starting about 10 days ago.  CBG at urgent care was over 600, so he was sent here for further evaluation.  CBG in triage in the emergency department 489.  Review of Systems  Positive: As above Negative: Fever, chills, abdominal pain, nausea, vomiting, diarrhea  Physical Exam  BP (!) 144/102 (BP Location: Right Arm)    Pulse 76    Temp 98.7 F (37.1 C) (Oral)    Resp 18    SpO2 99%  Gen:   Awake, no distress   Resp:  Normal effort  MSK:   Moves extremities without difficulty  Other:    Medical Decision Making  Medically screening exam initiated at 11:29 AM.  Appropriate orders placed.  Aurther Loft was informed that the remainder of the evaluation will be completed by another provider, this initial triage assessment does not replace that evaluation, and the importance of remaining in the ED until their evaluation is complete.     Renisha Cockrum T, PA-C 01/28/22 1130

## 2022-01-28 NOTE — ED Notes (Signed)
Patient is being discharged from the Urgent Care and sent to the Emergency Department via POC . Per PA, patient is in need of higher level of care due to hyperglycemic. Patient is aware and verbalizes understanding of plan of care.  Vitals:   01/28/22 1024  BP: (!) 145/90  Pulse: 71  Resp: 20  Temp: 98.1 F (36.7 C)  SpO2: 97%

## 2022-01-28 NOTE — ED Triage Notes (Signed)
Patient reports he has been told he is "prediabetic".  Patient believes things have worsened.  Reports fatigue, urinating at night, dry mouth.

## 2022-01-28 NOTE — Discharge Instructions (Addendum)
-  Please had to the emergency department for further evaluation of your very high blood sugars. -You are at high risk of a complication called DKA  with sugars this high. Diabetic ketoacidosis (DKA) is a serious complication of diabetes that can be life-threatening. DKA is most common among people with type 1 diabetes. People with type 2 diabetes can also develop DKA. DKA develops when your body doesn't have enough insulin to allow blood sugar into your cells for use as energy. -Please head straight to the ED.

## 2022-01-28 NOTE — ED Triage Notes (Signed)
Patient without diagnosis of diabetes here with complaint of polyuria, polydipsia, and fatigue that started approximately ten days ago. CBG 489 in triage. Patient alert, oriented, and in no apparent distress at this time.

## 2022-01-28 NOTE — ED Triage Notes (Signed)
Glucose 536

## 2022-01-28 NOTE — ED Notes (Signed)
Patient has a new patient appt for march 23.

## 2022-01-29 LAB — URINALYSIS, ROUTINE W REFLEX MICROSCOPIC
Bilirubin Urine: NEGATIVE
Glucose, UA: >=500 mg/dL — AB
Hgb urine dipstick: NEGATIVE
Ketones, ur: 40 mg/dL — AB
Leukocytes,Ua: NEGATIVE
Nitrite: NEGATIVE
Protein, ur: NEGATIVE mg/dL
Specific Gravity, Urine: 1.01 (ref 1.005–1.030)
pH: 6 (ref 5.0–8.0)

## 2022-01-29 LAB — I-STAT VENOUS BLOOD GAS, ED
Acid-Base Excess: 1 mmol/L (ref 0.0–2.0)
Bicarbonate: 26.7 mmol/L (ref 20.0–28.0)
Calcium, Ion: 1.23 mmol/L (ref 1.15–1.40)
HCT: 45 % (ref 39.0–52.0)
Hemoglobin: 15.3 g/dL (ref 13.0–17.0)
O2 Saturation: 99 %
Patient temperature: 37
Potassium: 4.3 mmol/L (ref 3.5–5.1)
Sodium: 132 mmol/L — ABNORMAL LOW (ref 135–145)
TCO2: 28 mmol/L (ref 22–32)
pCO2, Ven: 47.3 mmHg (ref 44.0–60.0)
pH, Ven: 7.36 (ref 7.250–7.430)
pO2, Ven: 163 mmHg — ABNORMAL HIGH (ref 32.0–45.0)

## 2022-01-29 LAB — URINALYSIS, MICROSCOPIC (REFLEX)
Bacteria, UA: NONE SEEN
Squamous Epithelial / LPF: NONE SEEN (ref 0–5)

## 2022-01-29 LAB — CBG MONITORING, ED
Glucose-Capillary: 477 mg/dL — ABNORMAL HIGH (ref 70–99)
Glucose-Capillary: 354 mg/dL — ABNORMAL HIGH (ref 70–99)

## 2022-01-29 MED ORDER — LACTATED RINGERS IV BOLUS
1500.0000 mL | Freq: Once | INTRAVENOUS | Status: AC
  Administered 2022-01-29: 1500 mL via INTRAVENOUS

## 2022-01-29 MED ORDER — METFORMIN HCL 500 MG PO TABS: 500.0000 mg | ORAL_TABLET | Freq: Two times a day (BID) | ORAL | 1 refills | Status: DC

## 2022-01-29 NOTE — ED Notes (Signed)
E-signature pad unavailable at time of pt discharge. This RN discussed discharge materials with pt and answered all pt questions. Pt stated understanding of discharge material. ? ?

## 2022-01-29 NOTE — ED Provider Notes (Signed)
[facility] [address] EMERGENCY DEPARTMENT Provider Note   CSN: [id number] Arrival date & time: 01/28/22  1112     History  Chief Complaint  Patient presents with   Hyperglycemia    Kurt Downs is a 51 y.o. male.  HPI Patient is a 51 year old male who was sent to the emergency department from urgent care due to hyperglycemia.  Patient initially went to urgent care due to fatigue, polyuria, xerostomia, fatigue, as well as blurry vision.  States that he has not followed up with his PCP in years but does have a new appointment next month.  Reports some nausea but none currently.  No vomiting, dizziness, chest pain, shortness of breath, abdominal pain, numbness, weakness, headache, dysuria.    Home Medications Prior to Admission medications   Medication Sig Start Date End Date Taking? Authorizing Provider  metFORMIN (GLUCOPHAGE) 500 MG tablet Take 1 tablet (500 mg total) by mouth 2 (two) times daily with a meal. 01/29/22 03/30/22 Yes Shetara Launer, PA-C  Multiple Vitamin (MULTIVITAMIN) tablet Take 1 tablet by mouth daily.   Yes [provider]  Phenyleph-Doxylamine-DM-APAP (ALKA SELTZER PLUS PO) Take 1 tablet by mouth 2 (two) times daily as needed (sore throat).   Yes [provider]      Allergies    Patient has no known allergies.    Review of Systems   Review of Systems  All other systems reviewed and are negative. Ten systems reviewed and are negative for acute change, except as noted in the HPI.   Physical Exam Updated Vital Signs BP 130/89    Pulse (!) 56    Temp 98.5 F (36.9 C) (Oral)    Resp 19    SpO2 99%  Physical Exam Vitals and nursing note reviewed.  Constitutional:      General: He is not in acute distress.    Appearance: Normal appearance. He is not ill-appearing, toxic-appearing or diaphoretic.  HENT:     Head: Normocephalic and atraumatic.     Right Ear: External ear normal.     Left Ear: External ear normal.     Nose:  Nose normal.     Mouth/Throat:     Mouth: Mucous membranes are moist.     Pharynx: Oropharynx is clear. No oropharyngeal exudate or posterior oropharyngeal erythema.  Eyes:     General: No scleral icterus.       Right eye: No discharge.        Left eye: No discharge.     Extraocular Movements: Extraocular movements intact.     Conjunctiva/sclera: Conjunctivae normal.  Cardiovascular:     Rate and Rhythm: Normal rate and regular rhythm.     Pulses: Normal pulses.     Heart sounds: Normal heart sounds. No murmur heard.   No friction rub. No gallop.  Pulmonary:     Effort: Pulmonary effort is normal. No respiratory distress.     Breath sounds: Normal breath sounds. No stridor. No wheezing, rhonchi or rales.  Abdominal:     General: Abdomen is flat.     Palpations: Abdomen is soft.     Tenderness: There is no abdominal tenderness.     Comments: Abdomen is soft and nontender.  Musculoskeletal:        General: Normal range of motion.     Cervical back: Normal range of motion and neck supple. No tenderness.  Skin:    General: Skin is warm and dry.  Neurological:     General: No  focal deficit present.     Mental Status: He is alert and oriented to person, place, and time.  Psychiatric:        Mood and Affect: Mood normal.        Behavior: Behavior normal.   ED Results / Procedures / Treatments   Labs (all labs ordered are listed, but only abnormal results are displayed) Labs Reviewed  BASIC METABOLIC PANEL - Abnormal; Notable for the following components:      Result Value   Sodium 131 (*)    Chloride 95 (*)    Glucose, Bld 536 (*)    All other components within normal limits  URINALYSIS, ROUTINE W REFLEX MICROSCOPIC - Abnormal; Notable for the following components:   Glucose, UA >=500 (*)    Ketones, ur 40 (*)    All other components within normal limits  CBG MONITORING, ED - Abnormal; Notable for the following components:   Glucose-Capillary 489 (*)    All other  components within normal limits  CBG MONITORING, ED - Abnormal; Notable for the following components:   Glucose-Capillary 388 (*)    All other components within normal limits  CBG MONITORING, ED - Abnormal; Notable for the following components:   Glucose-Capillary 477 (*)    All other components within normal limits  I-STAT VENOUS BLOOD GAS, ED - Abnormal; Notable for the following components:   pO2, Ven 163.0 (*)    Sodium 132 (*)    All other components within normal limits  CBG MONITORING, ED - Abnormal; Notable for the following components:   Glucose-Capillary 354 (*)    All other components within normal limits  CBC  URINALYSIS, MICROSCOPIC (REFLEX)   EKG None  Radiology No results found.  Procedures Procedures   Medications Ordered in ED Medications  lactated ringers bolus 1,500 mL (1,500 mLs Intravenous New Bag/Given 01/29/22 0200)   ED Course/ Medical Decision Making/ A&P                           Medical Decision Making Amount and/or Complexity of Data Reviewed Labs: ordered.  Risk Prescription drug management.  Pt is a 51 y.o. male who presents to the emergency department due to hyperglycemia.  He initially presented to urgent care due to fatigue, polyuria, xerostomia, fatigue, as well as blurry vision.  He was found to be hyperglycemic and sent to the emergency department for further management.  Labs: CBC without abnormalities. BMP with a pseudohyponatremia of 131, chloride of 95, glucose of 536. UA with greater than 500 glucose and 40 ketones. VBG with a pH of 7.36.  I, Rayna Sexton, PA-C, personally reviewed and evaluated these images and lab results as part of my medical decision-making.  On my exam heart is regular rate and rhythm without murmurs, rubs, or gallops.  Abdomen is soft and nontender.  Lungs are clear to auscultation bilaterally.  A&O x3.  Speaking clearly and coherently.  Patient denies any nausea or vomiting.  Initial glucose of 536.   Patient given 1.5 L of lactated Ringer's with a repeat CBG of 354.  Notes moderate improvement in his symptoms.  Patient does have 40 urine ketones but pH is within normal limits at 7.36.  Does not appear to clinically be in DKA or HHS at this time.  Feel that the patient is stable for discharge at this time and he is agreeable.  We will discharge him on a course of metformin.  Urged patient  to reach back out to his PCP to schedule an appointment for reevaluation as soon as possible.  We discussed return precautions.  His questions were answered and he was amicable at the time of discharge.  Note: Portions of this report may have been transcribed using voice recognition software. Every effort was made to ensure accuracy; however, inadvertent computerized transcription errors may be present.   Final Clinical Impression(s) / ED Diagnoses Final diagnoses:  Hyperglycemia   Rx / DC Orders ED Discharge Orders          Ordered    metFORMIN (GLUCOPHAGE) 500 MG tablet  2 times daily with meals        01/29/22 0346              Rayna Sexton, PA-C 01/29/22 0353    Mesner, Corene Cornea, MD 01/29/22 [phone]

## 2022-01-29 NOTE — Discharge Instructions (Addendum)
You were found to have high blood sugar today.  This is likely due to a new diagnosis of diabetes mellitus.  I am prescribing you medication called metformin.  I want you to take this twice a day.  Begin taking this immediately.  Please take this as prescribed.  Please call your new primary care provider and work to schedule an appointment for follow-up as soon as possible.  If you develop any new or worsening symptoms please come back to the emergency department immediately.

## 2022-03-13 ENCOUNTER — Other Ambulatory Visit: Payer: Self-pay

## 2022-03-13 ENCOUNTER — Ambulatory Visit: Payer: 59 | Admitting: Internal Medicine

## 2022-03-13 ENCOUNTER — Encounter: Payer: Self-pay | Admitting: Internal Medicine

## 2022-03-13 VITALS — BP 128/84 | HR 85 | Temp 98.1°F | Ht 73.0 in | Wt 234.0 lb

## 2022-03-13 DIAGNOSIS — Z1159 Encounter for screening for other viral diseases: Secondary | ICD-10-CM

## 2022-03-13 DIAGNOSIS — E119 Type 2 diabetes mellitus without complications: Secondary | ICD-10-CM

## 2022-03-13 DIAGNOSIS — Z794 Long term (current) use of insulin: Secondary | ICD-10-CM

## 2022-03-13 DIAGNOSIS — Z0001 Encounter for general adult medical examination with abnormal findings: Secondary | ICD-10-CM | POA: Insufficient documentation

## 2022-03-13 DIAGNOSIS — N5201 Erectile dysfunction due to arterial insufficiency: Secondary | ICD-10-CM | POA: Diagnosis not present

## 2022-03-13 DIAGNOSIS — E785 Hyperlipidemia, unspecified: Secondary | ICD-10-CM | POA: Diagnosis not present

## 2022-03-13 LAB — URINALYSIS, ROUTINE W REFLEX MICROSCOPIC
Bilirubin Urine: NEGATIVE
Hgb urine dipstick: NEGATIVE
Ketones, ur: NEGATIVE
Leukocytes,Ua: NEGATIVE
Nitrite: NEGATIVE
RBC / HPF: NONE SEEN (ref 0–?)
Specific Gravity, Urine: >=1.03 — AB (ref 1.000–1.030)
Total Protein, Urine: NEGATIVE
Urine Glucose: 100 — AB
Urobilinogen, UA: 0.2 (ref 0.0–1.0)
pH: 5.5 (ref 5.0–8.0)

## 2022-03-13 LAB — PSA: PSA: 0.67 ng/mL (ref 0.10–4.00)

## 2022-03-13 LAB — BASIC METABOLIC PANEL
BUN: 11 mg/dL (ref 6–23)
CO2: 31 mEq/L (ref 19–32)
Calcium: 9.2 mg/dL (ref 8.4–10.5)
Chloride: 99 mEq/L (ref 96–112)
Creatinine, Ser: 0.83 mg/dL (ref 0.40–1.50)
GFR: 100.93 mL/min (ref >60.00)
Glucose, Bld: 194 mg/dL — ABNORMAL HIGH (ref 70–99)
Potassium: 4.1 mEq/L (ref 3.5–5.1)
Sodium: 136 mEq/L (ref 135–145)

## 2022-03-13 LAB — HEPATIC FUNCTION PANEL
ALT: 30 U/L (ref 0–53)
AST: 24 U/L (ref 0–37)
Albumin: 4.3 g/dL (ref 3.5–5.2)
Alkaline Phosphatase: 78 U/L (ref 39–117)
Bilirubin, Direct: 0.1 mg/dL (ref 0.0–0.3)
Total Bilirubin: 0.5 mg/dL (ref 0.2–1.2)
Total Protein: 6.5 g/dL (ref 6.0–8.3)

## 2022-03-13 LAB — MICROALBUMIN / CREATININE URINE RATIO
Creatinine,U: 202.4 mg/dL
Microalb Creat Ratio: 0.6 mg/g (ref 0.0–30.0)
Microalb, Ur: 1.2 mg/dL (ref 0.0–1.9)

## 2022-03-13 LAB — POCT GLYCOSYLATED HEMOGLOBIN (HGB A1C): Hemoglobin A1C: 10.8 % — AB (ref 4.0–5.6)

## 2022-03-13 LAB — GLUCOSE, POCT (MANUAL RESULT ENTRY): POC Glucose: 233 mg/dl — AB (ref 70–99)

## 2022-03-13 LAB — TSH: TSH: 0.47 u[IU]/mL (ref 0.35–5.50)

## 2022-03-13 MED ORDER — INSULIN PEN NEEDLE 32G X 6 MM MISC: 1.0000 | Freq: Every day | 1 refills | Status: AC

## 2022-03-13 MED ORDER — FREESTYLE LIBRE 2 SENSOR MISC: 1.0000 | Freq: Every day | 5 refills | Status: AC

## 2022-03-13 MED ORDER — METFORMIN HCL ER 750 MG PO TB24: 1500.0000 mg | ORAL_TABLET | Freq: Every day | ORAL | 0 refills | Status: DC

## 2022-03-13 MED ORDER — ROSUVASTATIN CALCIUM 20 MG PO TABS: 20.0000 mg | ORAL_TABLET | Freq: Every day | ORAL | 1 refills | Status: AC

## 2022-03-13 MED ORDER — FREESTYLE LIBRE 2 READER DEVI: 1.0000 | Freq: Every day | 5 refills | Status: AC

## 2022-03-13 MED ORDER — SOLIQUA 100-33 UNT-MCG/ML ~~LOC~~ SOPN: 60.0000 [IU] | PEN_INJECTOR | Freq: Every day | SUBCUTANEOUS | 1 refills | Status: AC

## 2022-03-13 MED ORDER — ASPIRIN EC 81 MG PO TBEC: 81.0000 mg | DELAYED_RELEASE_TABLET | Freq: Every day | ORAL | 3 refills | Status: AC

## 2022-03-13 NOTE — Progress Notes (Signed)
? ?Subjective:  ?Patient ID: Kurt Downs, male    DOB: 20-Sep-1971  Age: 51 y.o. MRN: [id number] ? ?CC: Annual Exam, Hyperlipidemia, and Diabetes ? ?This visit occurred during the SARS-CoV-2 public health emergency.  Safety protocols were in place, including screening questions prior to the visit, additional usage of staff PPE, and extensive cleaning of exam room while observing appropriate contact time as indicated for disinfecting solutions.   ? ?HPI ?Kurt Downs presents for a CPX and to establish. ? ?He was seen at an urgent care center about 6 weeks ago and was found to have a blood sugar above 500.  Metformin was started.  He complains of a several month history of visual disturbance and polys.  He is active and denies chest pain, shortness of breath, diaphoresis, or edema. ? ?History ?Kurt Downs has no past medical history on file.  ? ?He has no past surgical history on file.  ? ?His family history includes Healthy in his father and mother.He reports that he has never smoked. He has never used smokeless tobacco. He reports current alcohol use of about 10.0 standard drinks per week. He reports that he does not use drugs. ? ?Outpatient Medications Prior to Visit  ?Medication Sig Dispense Refill  ? Multiple Vitamin (MULTIVITAMIN) tablet Take 1 tablet by mouth daily.    ? metFORMIN (GLUCOPHAGE) 500 MG tablet Take 1 tablet (500 mg total) by mouth 2 (two) times daily with a meal. 60 tablet 1  ? Phenyleph-Doxylamine-DM-APAP (ALKA SELTZER PLUS PO) Take 1 tablet by mouth 2 (two) times daily as needed (sore throat).    ? ?No facility-administered medications prior to visit.  ? ? ?ROS ?Review of Systems  ?Constitutional:  Negative for chills, diaphoresis, fatigue and fever.  ?HENT: Negative.    ?Eyes:  Positive for visual disturbance. Negative for photophobia.  ?Respiratory:  Negative for cough, chest tightness, shortness of breath and wheezing.   ?Cardiovascular:  Negative for chest pain, palpitations and  leg swelling.  ?Gastrointestinal:  Negative for abdominal pain, diarrhea, nausea and vomiting.  ?Endocrine: Positive for polydipsia and polyuria. Negative for polyphagia.  ?Genitourinary: Negative.  Negative for difficulty urinating and hematuria.  ?Musculoskeletal: Negative.   ?Skin: Negative.   ?Neurological:  Negative for dizziness, weakness and light-headedness.  ?Hematological:  Negative for adenopathy. Does not bruise/bleed easily.  ?Psychiatric/Behavioral: Negative.    ? ?Objective:  ?BP 128/84 (BP Location: Left Arm, Patient Position: Sitting, Cuff Size: Large)   Pulse 85   Temp 98.1 ?F (36.7 ?C) (Oral)   Ht 6\' 1"  (1.854 m)   Wt 234 lb (106.1 kg)   SpO2 97%   BMI 30.87 kg/m?  ? ?Physical Exam ?Vitals reviewed.  ?Constitutional:   ?   Appearance: Normal appearance. He is not ill-appearing.  ?HENT:  ?   Nose: Nose normal.  ?   Mouth/Throat:  ?   Mouth: Mucous membranes are moist.  ?   Pharynx: No oropharyngeal exudate.  ?Eyes:  ?   General: No scleral icterus. ?   Conjunctiva/sclera: Conjunctivae normal.  ?Cardiovascular:  ?   Rate and Rhythm: Normal rate and regular rhythm.  ?   Heart sounds: No murmur heard. ?Pulmonary:  ?   Effort: Pulmonary effort is normal.  ?   Breath sounds: No stridor. No wheezing, rhonchi or rales.  ?Abdominal:  ?   General: Abdomen is flat.  ?   Palpations: There is no mass.  ?   Tenderness: There is no abdominal tenderness. There is no  guarding.  ?   Hernia: No hernia is present.  ?Musculoskeletal:     ?   General: Normal range of motion.  ?   Cervical back: Neck supple.  ?   Right lower leg: No edema.  ?   Left lower leg: No edema.  ?Lymphadenopathy:  ?   Cervical: No cervical adenopathy.  ?Skin: ?   General: Skin is warm and dry.  ?Neurological:  ?   General: No focal deficit present.  ?   Mental Status: He is alert.  ?Psychiatric:     ?   Mood and Affect: Mood normal.     ?   Behavior: Behavior normal.  ? ? ?Lab Results  ?Component Value Date  ? WBC 6.6 01/28/2022  ? HGB  15.3 01/29/2022  ? HCT 45.0 01/29/2022  ? PLT 268 01/28/2022  ? GLUCOSE 194 (H) 03/13/2022  ? CHOL 204 (H) 02/27/2012  ? TRIG 110.0 02/27/2012  ? HDL 54.30 02/27/2012  ? LDLDIRECT 137.0 02/27/2012  ? ALT 30 03/13/2022  ? AST 24 03/13/2022  ? NA 136 03/13/2022  ? K 4.1 03/13/2022  ? CL 99 03/13/2022  ? CREATININE 0.83 03/13/2022  ? BUN 11 03/13/2022  ? CO2 31 03/13/2022  ? TSH 0.47 03/13/2022  ? PSA 0.67 03/13/2022  ? HGBA1C 10.8 (A) 03/13/2022  ? MICROALBUR 1.2 03/13/2022  ?  ? ?Assessment & Plan:  ? ?Kurt Downs was seen today for annual exam, hyperlipidemia and diabetes. ? ?Diagnoses and all orders for this visit: ? ?Insulin-requiring or dependent type II diabetes mellitus (HCC)- He has signs and symptoms of glucose toxicity.  Will increase the dose of metformin and will start basal insulin and a GLP-1 agonist.  He was given his first dose with samples in the office. ?-     Basic metabolic panel; Future ?-     Urinalysis, Routine w reflex microscopic; Future ?-     Hepatic function panel; Future ?-     Microalbumin / creatinine urine ratio; Future ?-     POCT Glucose (CBG) ?-     POCT glycosylated hemoglobin (Hb A1C) ?-     Insulin Glargine-Lixisenatide (SOLIQUA) 100-33 UNT-MCG/ML SOPN; Inject 60 Units into the skin daily. ?-     Microalbumin / creatinine urine ratio ?-     Hepatic function panel ?-     Urinalysis, Routine w reflex microscopic ?-     Basic metabolic panel ?-     HM Diabetes Foot Exam ?-     metFORMIN (GLUCOPHAGE-XR) 750 MG 24 hr tablet; Take 2 tablets (1,500 mg total) by mouth daily with breakfast. ?-     Continuous Blood Gluc Sensor (FREESTYLE LIBRE 2 SENSOR) MISC; 1 Act by Does not apply route daily. ?-     Continuous Blood Gluc Receiver (FREESTYLE LIBRE 2 READER) DEVI; 1 Act by Does not apply route daily. ?-     Insulin Pen Needle 32G X 6 MM MISC; 1 Act by Does not apply route daily. ?-     Amb Referral to Nutrition and Diabetic Education ? ?Encounter for general adult medical examination with  abnormal findings- Exam completed, labs reviewed, vaccines will be updated during his next visit, cancer screenings are up-to-date, patient education was given. ?-     PSA; Future ?-     Hepatitis C antibody; Future ?-     HIV Antibody (routine testing w rflx); Future ?-     HIV Antibody (routine testing w rflx) ?-  Hepatitis C antibody ?-     PSA ? ?Hyperlipidemia LDL goal <100- I have asked her to take a statin for cardiovascular risk reduction. ?-     TSH; Future ?-     TSH ?-     rosuvastatin (CRESTOR) 20 MG tablet; Take 1 tablet (20 mg total) by mouth daily. ?-     aspirin EC 81 MG tablet; Take 1 tablet (81 mg total) by mouth daily. ? ?Erectile dysfunction due to arterial insufficiency- He has a mildly low testosterone level.  I think the ED is mostly related to poorly controlled diabetes.  Will consider treatment options in the future. ?-     TSH; Future ?-     Testosterone Total,Free,Bio, Males; Future ?-     Testosterone Total,Free,Bio, Males ?-     TSH ? ?Need for hepatitis C screening test ?-     Hepatitis C antibody; Future ?-     Hepatitis C antibody ? ? ?I have discontinued Kurt Downs's Phenyleph-Doxylamine-DM-APAP (ALKA SELTZER PLUS PO) and metFORMIN. I am also having him start on Soliqua, metFORMIN, FreeStyle Libre 2 Sensor, [employer] 2 Reader, Insulin Pen Needle, rosuvastatin, and aspirin EC. Additionally, I am having him maintain his multivitamin. ? ?Meds ordered this encounter  ?Medications  ? Insulin Glargine-Lixisenatide (SOLIQUA) 100-33 UNT-MCG/ML SOPN  ?  Sig: Inject 60 Units into the skin daily.  ?  Dispense:  54 mL  ?  Refill:  1  ? metFORMIN (GLUCOPHAGE-XR) 750 MG 24 hr tablet  ?  Sig: Take 2 tablets (1,500 mg total) by mouth daily with breakfast.  ?  Dispense:  180 tablet  ?  Refill:  0  ? Continuous Blood Gluc Sensor (FREESTYLE LIBRE 2 SENSOR) MISC  ?  Sig: 1 Act by Does not apply route daily.  ?  Dispense:  2 each  ?  Refill:  5  ? Continuous Blood Gluc Receiver  (FREESTYLE LIBRE 2 READER) DEVI  ?  Sig: 1 Act by Does not apply route daily.  ?  Dispense:  2 each  ?  Refill:  5  ? Insulin Pen Needle 32G X 6 MM MISC  ?  Sig: 1 Act by Does not apply route daily.  ?  Dispense:  10

## 2022-03-13 NOTE — Patient Instructions (Signed)
[facility] - ? ?Start with 20 units each day.  Increase by 2 units every 3 days until you get to 60 units per day or have a blood sugar <70. ? ? ?Health Maintenance, Male ?Adopting a healthy lifestyle and getting preventive care are important in promoting health and wellness. Ask your health care provider about: ?The right schedule for you to have regular tests and exams. ?Things you can do on your own to prevent diseases and keep yourself healthy. ?What should I know about diet, weight, and exercise? ?Eat a healthy diet ? ?Eat a diet that includes plenty of vegetables, fruits, low-fat dairy products, and lean protein. ?Do not eat a lot of foods that are high in solid fats, added sugars, or sodium. ?Maintain a healthy weight ?Body mass index (BMI) is a measurement that can be used to identify possible weight problems. It estimates body fat based on height and weight. Your health care provider can help determine your BMI and help you achieve or maintain a healthy weight. ?Get regular exercise ?Get regular exercise. This is one of the most important things you can do for your health. Most adults should: ?Exercise for at least 150 minutes each week. The exercise should increase your heart rate and make you sweat (moderate-intensity exercise). ?Do strengthening exercises at least twice a week. This is in addition to the moderate-intensity exercise. ?Spend less time sitting. Even light physical activity can be beneficial. ?Watch cholesterol and blood lipids ?Have your blood tested for lipids and cholesterol at 52 years of age, then have this test every 5 years. ?You may need to have your cholesterol levels checked more often if: ?Your lipid or cholesterol levels are high. ?You are older than 52 years of age. ?You are at high risk for heart disease. ?What should I know about cancer screening? ?Many types of cancers can be detected early and may often be prevented. Depending on your health history and family history, you may  need to have cancer screening at various ages. This may include screening for: ?Colorectal cancer. ?Prostate cancer. ?Skin cancer. ?Lung cancer. ?What should I know about heart disease, diabetes, and high blood pressure? ?Blood pressure and heart disease ?High blood pressure causes heart disease and increases the risk of stroke. This is more likely to develop in people who have high blood pressure readings or are overweight. ?Talk with your health care provider about your target blood pressure readings. ?Have your blood pressure checked: ?Every 3-5 years if you are 45-2 years of age. ?Every year if you are 29 years old or older. ?If you are between the ages of 79 and 5 and are a current or former smoker, ask your health care provider if you should have a one-time screening for abdominal aortic aneurysm (AAA). ?Diabetes ?Have regular diabetes screenings. This checks your fasting blood sugar level. Have the screening done: ?Once every three years after age 77 if you are at a normal weight and have a low risk for diabetes. ?More often and at a younger age if you are overweight or have a high risk for diabetes. ?What should I know about preventing infection? ?Hepatitis B ?If you have a higher risk for hepatitis B, you should be screened for this virus. Talk with your health care provider to find out if you are at risk for hepatitis B infection. ?Hepatitis C ?Blood testing is recommended for: ?Everyone born from [birth year] through 1966. ?Anyone with known risk factors for hepatitis C. ?Sexually transmitted infections (STIs) ?You should  be screened each year for STIs, including gonorrhea and chlamydia, if: ?You are sexually active and are younger than 52 years of age. ?You are older than 52 years of age and your health care provider tells you that you are at risk for this type of infection. ?Your sexual activity has changed since you were last screened, and you are at increased risk for chlamydia or gonorrhea. Ask your health  care provider if you are at risk. ?Ask your health care provider about whether you are at high risk for HIV. Your health care provider may recommend a prescription medicine to help prevent HIV infection. If you choose to take medicine to prevent HIV, you should first get tested for HIV. You should then be tested every 3 months for as long as you are taking the medicine. ?Follow these instructions at home: ?Alcohol use ?Do not drink alcohol if your health care provider tells you not to drink. ?If you drink alcohol: ?Limit how much you have to 0-2 drinks a day. ?Know how much alcohol is in your drink. In the [place] one drink equals one 12 oz bottle of beer (355 mL), one 5 oz glass of wine (148 mL), or one 1? oz glass of hard liquor (44 mL). ?Lifestyle ?Do not use any products that contain nicotine or tobacco. These products include cigarettes, chewing tobacco, and vaping devices, such as e-cigarettes. If you need help quitting, ask your health care provider. ?Do not use street drugs. ?Do not share needles. ?Ask your health care provider for help if you need support or information about quitting drugs. ?General instructions ?Schedule regular health, dental, and eye exams. ?Stay current with your vaccines. ?Tell your health care provider if: ?You often feel depressed. ?You have ever been abused or do not feel safe at home. ?Summary ?Adopting a healthy lifestyle and getting preventive care are important in promoting health and wellness. ?Follow your health care provider's instructions about healthy diet, exercising, and getting tested or screened for diseases. ?Follow your health care provider's instructions on monitoring your cholesterol and blood pressure. ?This information is not intended to replace advice given to you by your health care provider. Make sure you discuss any questions you have with your health care provider. ?Document Revised: 04/29/2021 Document Reviewed: 04/29/2021 ?[employer] Patient Education ? 2022  [employer] ? ?

## 2022-03-14 ENCOUNTER — Encounter: Payer: Self-pay | Admitting: Internal Medicine

## 2022-03-14 LAB — TESTOSTERONE TOTAL,FREE,BIO, MALES
Albumin: 4.3 g/dL (ref 3.6–5.1)
Sex Hormone Binding: 17 nmol/L (ref 10–50)
Testosterone: 229 ng/dL — ABNORMAL LOW (ref 250–827)

## 2022-03-14 LAB — HIV ANTIBODY (ROUTINE TESTING W REFLEX): HIV 1&2 Ab, 4th Generation: NONREACTIVE

## 2022-03-14 LAB — HEPATITIS C ANTIBODY
Hepatitis C Ab: NONREACTIVE
SIGNAL TO CUT-OFF: 0.05 (ref <1.00)

## 2022-04-08 ENCOUNTER — Other Ambulatory Visit: Payer: Self-pay | Admitting: Internal Medicine

## 2022-04-08 ENCOUNTER — Telehealth: Payer: Self-pay | Admitting: Internal Medicine

## 2022-04-08 DIAGNOSIS — E291 Testicular hypofunction: Secondary | ICD-10-CM

## 2022-04-08 MED ORDER — XYOSTED 75 MG/0.5ML ~~LOC~~ SOAJ: 75.0000 mg | SUBCUTANEOUS | 1 refills | Status: AC

## 2022-04-08 NOTE — Telephone Encounter (Signed)
Pt has been informed that Rx was sent to the pharmacy.  ?

## 2022-04-08 NOTE — Telephone Encounter (Signed)
Pt called in requesting to have Viagra called in for his low testosterone.  ?

## 2022-05-22 ENCOUNTER — Encounter: Payer: 59 | Attending: Internal Medicine | Admitting: Dietician

## 2022-06-19 ENCOUNTER — Other Ambulatory Visit: Payer: Self-pay | Admitting: Internal Medicine

## 2022-06-19 DIAGNOSIS — E119 Type 2 diabetes mellitus without complications: Secondary | ICD-10-CM

## 2022-08-18 ENCOUNTER — Ambulatory Visit: Payer: 59 | Admitting: Dietician

## 2023-03-06 ENCOUNTER — Other Ambulatory Visit: Payer: Self-pay | Admitting: Internal Medicine

## 2023-03-06 DIAGNOSIS — E785 Hyperlipidemia, unspecified: Secondary | ICD-10-CM

## 2023-03-11 ENCOUNTER — Other Ambulatory Visit: Payer: Self-pay | Admitting: Internal Medicine

## 2023-03-11 DIAGNOSIS — E119 Type 2 diabetes mellitus without complications: Secondary | ICD-10-CM
